# Patient Record
Sex: Male | Born: 1955 | Race: Black or African American | Hispanic: No | Marital: Single | Smoking: Current every day smoker
Health system: Southern US, Community
[De-identification: ages and names within clinical notes are randomized; demographics above are authoritative.]

## PROBLEM LIST (undated history)

## (undated) DIAGNOSIS — E119 Type 2 diabetes mellitus without complications: Secondary | ICD-10-CM

## (undated) HISTORY — PX: EYE SURGERY: SHX253

---

## 2000-01-06 ENCOUNTER — Encounter: Payer: Self-pay | Admitting: Emergency Medicine

## 2000-01-06 ENCOUNTER — Emergency Department (HOSPITAL_COMMUNITY): Admission: EM | Admit: 2000-01-06 | Discharge: 2000-01-06 | Payer: Self-pay | Admitting: Emergency Medicine

## 2009-04-14 ENCOUNTER — Ambulatory Visit: Payer: Self-pay | Admitting: Internal Medicine

## 2009-04-18 ENCOUNTER — Ambulatory Visit: Payer: Self-pay | Admitting: Internal Medicine

## 2009-05-10 ENCOUNTER — Encounter (INDEPENDENT_AMBULATORY_CARE_PROVIDER_SITE_OTHER): Payer: Self-pay | Admitting: Family Medicine

## 2009-05-10 ENCOUNTER — Ambulatory Visit: Payer: Self-pay | Admitting: Internal Medicine

## 2009-05-10 LAB — CONVERTED CEMR LAB
ALT: 24 units/L (ref 0–53)
AST: 17 units/L (ref 0–37)
Albumin: 4.3 g/dL (ref 3.5–5.2)
Alkaline Phosphatase: 105 units/L (ref 39–117)
BUN: 13 mg/dL (ref 6–23)
Basophils Absolute: 0 10*3/uL (ref 0.0–0.1)
Basophils Relative: 0 % (ref 0–1)
CO2: 22 meq/L (ref 19–32)
Calcium: 9.2 mg/dL (ref 8.4–10.5)
Chloride: 105 meq/L (ref 96–112)
Cholesterol: 215 mg/dL — ABNORMAL HIGH (ref 0–200)
Creatinine, Ser: 1.11 mg/dL (ref 0.40–1.50)
Eosinophils Absolute: 0.6 10*3/uL (ref 0.0–0.7)
Eosinophils Relative: 5 % (ref 0–5)
Glucose, Bld: 77 mg/dL (ref 70–99)
HCT: 48.3 % (ref 39.0–52.0)
HDL: 49 mg/dL (ref >39)
Hemoglobin: 15 g/dL (ref 13.0–17.0)
LDL Cholesterol: 128 mg/dL — ABNORMAL HIGH (ref 0–99)
Lymphocytes Relative: 39 % (ref 12–46)
Lymphs Abs: 4.3 10*3/uL — ABNORMAL HIGH (ref 0.7–4.0)
MCHC: 31.1 g/dL (ref 30.0–36.0)
MCV: 89.9 fL (ref 78.0–100.0)
Microalb, Ur: 0.99 mg/dL (ref 0.00–1.89)
Monocytes Absolute: 1.1 10*3/uL — ABNORMAL HIGH (ref 0.1–1.0)
Monocytes Relative: 10 % (ref 3–12)
Neutro Abs: 5 10*3/uL (ref 1.7–7.7)
Neutrophils Relative %: 45 % (ref 43–77)
Platelets: 266 10*3/uL (ref 150–400)
Potassium: 4.3 meq/L (ref 3.5–5.3)
RBC: 5.37 M/uL (ref 4.22–5.81)
RDW: 13.6 % (ref 11.5–15.5)
Sodium: 140 meq/L (ref 135–145)
TSH: 1.96 microintl units/mL (ref 0.350–4.500)
Testosterone: 198.09 ng/dL — ABNORMAL LOW (ref 350–890)
Total Bilirubin: 0.6 mg/dL (ref 0.3–1.2)
Total CHOL/HDL Ratio: 4.4
Total Protein: 6.8 g/dL (ref 6.0–8.3)
Triglycerides: 188 mg/dL — ABNORMAL HIGH (ref <150)
VLDL: 38 mg/dL (ref 0–40)
Vit D, 25-Hydroxy: 27 ng/mL — ABNORMAL LOW (ref 30–89)
WBC: 11.1 10*3/uL — ABNORMAL HIGH (ref 4.0–10.5)

## 2009-05-16 ENCOUNTER — Encounter (INDEPENDENT_AMBULATORY_CARE_PROVIDER_SITE_OTHER): Payer: Self-pay | Admitting: Family Medicine

## 2009-05-16 ENCOUNTER — Ambulatory Visit: Payer: Self-pay | Admitting: Internal Medicine

## 2009-05-16 LAB — CONVERTED CEMR LAB: Microalb, Ur: 0.5 mg/dL (ref 0.00–1.89)

## 2009-07-06 ENCOUNTER — Ambulatory Visit: Payer: Self-pay | Admitting: Internal Medicine

## 2009-09-07 ENCOUNTER — Ambulatory Visit: Payer: Self-pay | Admitting: Internal Medicine

## 2010-02-02 ENCOUNTER — Encounter (INDEPENDENT_AMBULATORY_CARE_PROVIDER_SITE_OTHER): Payer: Self-pay | Admitting: *Deleted

## 2010-02-02 LAB — CONVERTED CEMR LAB
ALT: 28 units/L (ref 0–53)
AST: 17 units/L (ref 0–37)
Albumin: 4.8 g/dL (ref 3.5–5.2)
Alkaline Phosphatase: 116 units/L (ref 39–117)
BUN: 13 mg/dL (ref 6–23)
CO2: 24 meq/L (ref 19–32)
Calcium: 9.5 mg/dL (ref 8.4–10.5)
Chloride: 99 meq/L (ref 96–112)
Cholesterol: 200 mg/dL (ref 0–200)
Creatinine, Ser: 0.97 mg/dL (ref 0.40–1.50)
Glucose, Bld: 244 mg/dL — ABNORMAL HIGH (ref 70–99)
HDL: 35 mg/dL — ABNORMAL LOW (ref >39)
Hgb A1c MFr Bld: 10.5 % — ABNORMAL HIGH (ref <5.7)
LDL Cholesterol: 138 mg/dL — ABNORMAL HIGH (ref 0–99)
Potassium: 4.2 meq/L (ref 3.5–5.3)
Sodium: 135 meq/L (ref 135–145)
Total Bilirubin: 0.6 mg/dL (ref 0.3–1.2)
Total CHOL/HDL Ratio: 5.7
Total Protein: 7 g/dL (ref 6.0–8.3)
Triglycerides: 135 mg/dL (ref <150)
VLDL: 27 mg/dL (ref 0–40)

## 2012-06-17 ENCOUNTER — Encounter (HOSPITAL_COMMUNITY): Payer: Self-pay | Admitting: Emergency Medicine

## 2012-06-17 ENCOUNTER — Emergency Department (HOSPITAL_COMMUNITY): Payer: No Typology Code available for payment source

## 2012-06-17 ENCOUNTER — Emergency Department (HOSPITAL_COMMUNITY)
Admission: EM | Admit: 2012-06-17 | Discharge: 2012-06-17 | Disposition: A | Discharge status: Home / Self Care | Payer: No Typology Code available for payment source | Attending: Emergency Medicine | Admitting: Emergency Medicine

## 2012-06-17 DIAGNOSIS — S0993XA Unspecified injury of face, initial encounter: Secondary | ICD-10-CM | POA: Insufficient documentation

## 2012-06-17 DIAGNOSIS — Z79899 Other long term (current) drug therapy: Secondary | ICD-10-CM | POA: Insufficient documentation

## 2012-06-17 DIAGNOSIS — Y9389 Activity, other specified: Secondary | ICD-10-CM | POA: Insufficient documentation

## 2012-06-17 DIAGNOSIS — M542 Cervicalgia: Secondary | ICD-10-CM

## 2012-06-17 DIAGNOSIS — F17200 Nicotine dependence, unspecified, uncomplicated: Secondary | ICD-10-CM | POA: Insufficient documentation

## 2012-06-17 DIAGNOSIS — M549 Dorsalgia, unspecified: Secondary | ICD-10-CM

## 2012-06-17 DIAGNOSIS — Y92410 Unspecified street and highway as the place of occurrence of the external cause: Secondary | ICD-10-CM | POA: Insufficient documentation

## 2012-06-17 DIAGNOSIS — IMO0002 Reserved for concepts with insufficient information to code with codable children: Secondary | ICD-10-CM | POA: Insufficient documentation

## 2012-06-17 DIAGNOSIS — E119 Type 2 diabetes mellitus without complications: Secondary | ICD-10-CM | POA: Insufficient documentation

## 2012-06-17 HISTORY — DX: Type 2 diabetes mellitus without complications: E11.9

## 2012-06-17 MED ORDER — OXYCODONE-ACETAMINOPHEN 5-325 MG PO TABS
1.0000 | ORAL_TABLET | Freq: Once | ORAL | Status: AC
  Administered 2012-06-17: 1 via ORAL
  Filled 2012-06-17: qty 1

## 2012-06-17 MED ORDER — METHOCARBAMOL 500 MG PO TABS: 500.0000 mg | ORAL_TABLET | Freq: Two times a day (BID) | ORAL | Status: DC | PRN

## 2012-06-17 MED ORDER — OXYCODONE-ACETAMINOPHEN 5-325 MG PO TABS: 1.0000 | ORAL_TABLET | ORAL | Status: DC | PRN
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
## 2012-06-19 NOTE — ED Provider Notes (Signed)
Medical screening examination/treatment/procedure(s) were performed by non-physician practitioner and as supervising physician I was immediately available for consultation/collaboration.   Gavin Pound. Kristin Lamagna, MD 06/19/12 1256

## 2014-06-02 ENCOUNTER — Emergency Department (HOSPITAL_COMMUNITY): Payer: 59

## 2014-06-02 ENCOUNTER — Emergency Department (HOSPITAL_COMMUNITY)
Admission: EM | Admit: 2014-06-02 | Discharge: 2014-06-02 | Disposition: A | Discharge status: Home / Self Care | Payer: 59 | Attending: Emergency Medicine | Admitting: Emergency Medicine

## 2014-06-02 ENCOUNTER — Encounter (HOSPITAL_COMMUNITY): Payer: Self-pay | Admitting: *Deleted

## 2014-06-02 DIAGNOSIS — S0181XA Laceration without foreign body of other part of head, initial encounter: Secondary | ICD-10-CM | POA: Insufficient documentation

## 2014-06-02 DIAGNOSIS — S20219A Contusion of unspecified front wall of thorax, initial encounter: Secondary | ICD-10-CM | POA: Insufficient documentation

## 2014-06-02 DIAGNOSIS — Z79899 Other long term (current) drug therapy: Secondary | ICD-10-CM | POA: Diagnosis not present

## 2014-06-02 DIAGNOSIS — Y998 Other external cause status: Secondary | ICD-10-CM | POA: Diagnosis not present

## 2014-06-02 DIAGNOSIS — Y9389 Activity, other specified: Secondary | ICD-10-CM | POA: Diagnosis not present

## 2014-06-02 DIAGNOSIS — Z23 Encounter for immunization: Secondary | ICD-10-CM | POA: Diagnosis not present

## 2014-06-02 DIAGNOSIS — Y9289 Other specified places as the place of occurrence of the external cause: Secondary | ICD-10-CM | POA: Insufficient documentation

## 2014-06-02 DIAGNOSIS — S0101XA Laceration without foreign body of scalp, initial encounter: Secondary | ICD-10-CM | POA: Insufficient documentation

## 2014-06-02 DIAGNOSIS — E119 Type 2 diabetes mellitus without complications: Secondary | ICD-10-CM | POA: Insufficient documentation

## 2014-06-02 DIAGNOSIS — Z720 Tobacco use: Secondary | ICD-10-CM | POA: Diagnosis not present

## 2014-06-02 DIAGNOSIS — S0990XA Unspecified injury of head, initial encounter: Secondary | ICD-10-CM | POA: Diagnosis present

## 2014-06-02 MED ORDER — MORPHINE SULFATE 4 MG/ML IJ SOLN
4.0000 mg | Freq: Once | INTRAMUSCULAR | Status: AC
  Administered 2014-06-02: 4 mg via INTRAVENOUS
  Filled 2014-06-02: qty 1

## 2014-06-02 MED ORDER — TETANUS-DIPHTH-ACELL PERTUSSIS 5-2.5-18.5 LF-MCG/0.5 IM SUSP
0.5000 mL | Freq: Once | INTRAMUSCULAR | Status: AC
  Administered 2014-06-02: 0.5 mL via INTRAMUSCULAR
  Filled 2014-06-02: qty 0.5

## 2014-06-02 MED ORDER — LIDOCAINE-EPINEPHRINE 1 %-1:100000 IJ SOLN
10.0000 mL | Freq: Once | INTRAMUSCULAR | Status: AC
  Administered 2014-06-02: 10 mL
  Filled 2014-06-02: qty 1

## 2014-06-02 MED ORDER — SODIUM CHLORIDE 0.9 % IV BOLUS (SEPSIS)
1000.0000 mL | Freq: Once | INTRAVENOUS | Status: AC
  Administered 2014-06-02: 1000 mL via INTRAVENOUS

## 2014-06-02 MED ORDER — HYDROCODONE-ACETAMINOPHEN 5-325 MG PO TABS: 1.0000 | ORAL_TABLET | Freq: Two times a day (BID) | ORAL | Status: DC | PRN

## 2014-06-02 NOTE — ED Notes (Signed)
Discharge instructions reviewed, voiced understanding.  

## 2014-06-02 NOTE — ED Notes (Signed)
Patient cleaned up of dried blood, Dr Mora Bellmanni at the bedside to suture and skin glue to upper lip, bacitracin applied
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
## 2014-06-13 ENCOUNTER — Emergency Department (HOSPITAL_COMMUNITY)
Admission: EM | Admit: 2014-06-13 | Discharge: 2014-06-13 | Disposition: A | Discharge status: Home / Self Care | Payer: 59 | Attending: Emergency Medicine | Admitting: Emergency Medicine

## 2014-06-13 ENCOUNTER — Encounter (HOSPITAL_COMMUNITY): Payer: Self-pay | Admitting: Physical Medicine and Rehabilitation

## 2014-06-13 DIAGNOSIS — S0993XD Unspecified injury of face, subsequent encounter: Secondary | ICD-10-CM | POA: Insufficient documentation

## 2014-06-13 DIAGNOSIS — E119 Type 2 diabetes mellitus without complications: Secondary | ICD-10-CM | POA: Insufficient documentation

## 2014-06-13 DIAGNOSIS — Z79899 Other long term (current) drug therapy: Secondary | ICD-10-CM | POA: Insufficient documentation

## 2014-06-13 DIAGNOSIS — Z7982 Long term (current) use of aspirin: Secondary | ICD-10-CM | POA: Insufficient documentation

## 2014-06-13 DIAGNOSIS — Z4802 Encounter for removal of sutures: Secondary | ICD-10-CM

## 2014-06-13 DIAGNOSIS — Z720 Tobacco use: Secondary | ICD-10-CM | POA: Diagnosis not present

## 2014-06-13 MED ORDER — HYDROCODONE-ACETAMINOPHEN 5-325 MG PO TABS: 1.0000 | ORAL_TABLET | Freq: Four times a day (QID) | ORAL | Status: DC | PRN

## 2014-06-13 MED ORDER — IBUPROFEN 800 MG PO TABS: 800.0000 mg | ORAL_TABLET | Freq: Three times a day (TID) | ORAL | Status: AC | PRN

## 2014-06-13 NOTE — Discharge Instructions (Signed)
Return here as needed.  Follow-up with a dentist .  Rinse your mouth with warm water and peroxide 3 times a day
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
## 2015-03-10 ENCOUNTER — Telehealth: Payer: Self-pay | Admitting: *Deleted

## 2015-03-10 NOTE — Telephone Encounter (Signed)
Called lm about missed pre-visit appointment and to call us back to reschedule before 5pm or we will need to cxl procedure-adm

## 2015-03-11 ENCOUNTER — Encounter: Payer: Self-pay | Admitting: Internal Medicine

## 2015-03-15 ENCOUNTER — Encounter: Payer: 59 | Admitting: Gastroenterology

## 2017-01-12 ENCOUNTER — Encounter (HOSPITAL_COMMUNITY): Payer: Self-pay | Admitting: Emergency Medicine

## 2017-01-12 ENCOUNTER — Emergency Department (HOSPITAL_COMMUNITY)
Admission: EM | Admit: 2017-01-12 | Discharge: 2017-01-12 | Disposition: A | Discharge status: Home / Self Care | Payer: No Typology Code available for payment source | Attending: Emergency Medicine | Admitting: Emergency Medicine

## 2017-01-12 ENCOUNTER — Other Ambulatory Visit: Payer: Self-pay

## 2017-01-12 DIAGNOSIS — M25511 Pain in right shoulder: Secondary | ICD-10-CM | POA: Insufficient documentation

## 2017-01-12 DIAGNOSIS — E119 Type 2 diabetes mellitus without complications: Secondary | ICD-10-CM | POA: Diagnosis not present

## 2017-01-12 DIAGNOSIS — M62838 Other muscle spasm: Secondary | ICD-10-CM | POA: Diagnosis not present

## 2017-01-12 DIAGNOSIS — M542 Cervicalgia: Secondary | ICD-10-CM

## 2017-01-12 DIAGNOSIS — Z7984 Long term (current) use of oral hypoglycemic drugs: Secondary | ICD-10-CM | POA: Insufficient documentation

## 2017-01-12 DIAGNOSIS — M25512 Pain in left shoulder: Secondary | ICD-10-CM | POA: Insufficient documentation

## 2017-01-12 MED ORDER — CYCLOBENZAPRINE HCL 5 MG PO TABS: 5.0000 mg | ORAL_TABLET | Freq: Two times a day (BID) | ORAL | 0 refills | Status: AC | PRN

## 2017-01-12 MED ORDER — CYCLOBENZAPRINE HCL 10 MG PO TABS
5.0000 mg | ORAL_TABLET | Freq: Once | ORAL | Status: AC
  Administered 2017-01-12: 5 mg via ORAL
  Filled 2017-01-12: qty 1

## 2017-01-12 NOTE — Discharge Instructions (Addendum)
Please take Ibuprofen (Advil, motrin) and Tylenol (acetaminophen) to relieve your pain.  You may take up to 600 MG (3 pills) of normal strength ibuprofen every 8 hours as needed.  In between doses of ibuprofen you make take tylenol, up to 1,000 mg (two extra strength pills).  Do not take more than 3,000 mg tylenol in a 24 hour period.  Please check all medication labels as many medications such as pain and cold medications may contain tylenol.  Do not drink alcohol while taking these medications.  Do not take other NSAID'S while taking ibuprofen (such as aleve or naproxen).  Please take ibuprofen with food to decrease stomach upset.  Today you received medications that may make you sleepy or impair your ability to make decisions.  For the next 24 hours please do not drive, operate heavy machinery, care for a small child with out another adult present, or perform any activities that may cause harm to you or someone else if you were to fall asleep or be impaired.   You are being prescribed a medication which may make you sleepy. Please follow up of listed precautions for at least 24 hours after taking one dose.  The best way to get rid of muscle pain is by taking NSAIDS (Ibuprofen), using heat, massage therapy, and gentle stretching/range of motion exercises.  It can take a few days for your muscles to start feeling better.
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
## 2017-01-12 NOTE — ED Notes (Signed)
Declined W/C at D/C and was escorted to lobby by RN. 

## 2018-11-25 ENCOUNTER — Other Ambulatory Visit: Payer: Self-pay

## 2018-11-25 DIAGNOSIS — Z20822 Contact with and (suspected) exposure to covid-19: Secondary | ICD-10-CM

## 2018-11-26 LAB — NOVEL CORONAVIRUS, NAA: SARS-CoV-2, NAA: NOT DETECTED

## 2019-08-05 ENCOUNTER — Ambulatory Visit: Payer: HRSA Program | Attending: Internal Medicine

## 2019-08-05 DIAGNOSIS — Z20822 Contact with and (suspected) exposure to covid-19: Secondary | ICD-10-CM | POA: Insufficient documentation

## 2019-08-06 LAB — SARS-COV-2, NAA 2 DAY TAT

## 2019-08-06 LAB — NOVEL CORONAVIRUS, NAA: SARS-CoV-2, NAA: NOT DETECTED

## 2019-08-07 ENCOUNTER — Ambulatory Visit (HOSPITAL_COMMUNITY)
Admission: EM | Admit: 2019-08-07 | Discharge: 2019-08-07 | Disposition: A | Discharge status: Home / Self Care | Payer: Self-pay

## 2019-08-07 ENCOUNTER — Encounter (HOSPITAL_COMMUNITY): Payer: Self-pay | Admitting: Emergency Medicine

## 2019-08-07 ENCOUNTER — Other Ambulatory Visit: Payer: Self-pay

## 2019-08-07 DIAGNOSIS — Z20822 Contact with and (suspected) exposure to covid-19: Secondary | ICD-10-CM
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
## 2019-08-07 NOTE — ED Provider Notes (Signed)
[facility]    CSN: [id number] Arrival date & time: 08/07/19  7371      History   Chief Complaint Chief Complaint  Patient presents with  . covid test for travel    HPI Anthony Tate is a 65 y.o. male.   Patient reports urgent care for Covid test for travel.  Denies symptoms.  Denies cough, headache, fever, chills, body ache, sore throat, nasal congestion, abdominal pain, nausea, vomiting, diarrhea.  No change in taste or smell.  No sick contacts.     Past Medical History:  Diagnosis Date  . Diabetes mellitus without complication (HCC)     There are no problems to display for this patient.   Past Surgical History:  Procedure Laterality Date  . EYE SURGERY     growth to eye removed       Home Medications    Prior to Admission medications   Medication Sig Start Date End Date Taking? Authorizing Provider  aspirin EC 81 MG tablet Take 81 mg by mouth daily.    [provider]  cyclobenzaprine (FLEXERIL) 5 MG tablet Take 1-2 tablets (5-10 mg total) by mouth 2 (two) times daily as needed for muscle spasms. 01/12/17   Cristina Gong, PA-C  glipiZIDE (GLUCOTROL) 10 MG tablet Take 10 mg by mouth 2 (two) times daily before a meal.    [provider]  HYDROcodone-acetaminophen (NORCO/VICODIN) 5-325 MG per tablet Take 1 tablet by mouth every 6 (six) hours as needed for moderate pain. 06/13/14   Lawyer, Cristal Deer, PA-C  ibuprofen (ADVIL,MOTRIN) 800 MG tablet Take 1 tablet (800 mg total) by mouth every 8 (eight) hours as needed. 06/13/14   Lawyer, Cristal Deer, PA-C  metFORMIN (GLUCOPHAGE) 500 MG tablet Take 500 mg by mouth 2 (two) times daily with a meal.    [provider]    Family History History reviewed. No pertinent family history.  Social History Social History   Tobacco Use  . Smoking status: Current Every Day Smoker    Packs/day: 0.50  . Smokeless tobacco: Current User  Substance Use Topics  . Alcohol use: No  .  Drug use: No     Allergies   Patient has no known allergies.   Review of Systems Review of Systems   Physical Exam Triage Vital Signs ED Triage Vitals [08/07/19 1059]  Enc Vitals Group     BP 139/74     Pulse Rate 92     Resp 18     Temp 98.2 F (36.8 C)     Temp Source Oral     SpO2 100 %     Weight      Height      Head Circumference      Peak Flow      Pain Score 0     Pain Loc      Pain Edu?      Excl. in GC?    No data found.  Updated Vital Signs BP 139/74 (BP Location: Right Arm)   Pulse 92   Temp 98.2 F (36.8 C) (Oral)   Resp 18   SpO2 100%   Visual Acuity Right Eye Distance:   Left Eye Distance:   Bilateral Distance:    Right Eye Near:   Left Eye Near:    Bilateral Near:     Physical Exam Vitals and nursing note reviewed.  Constitutional:      Appearance: Normal appearance.  Neurological:     Mental Status:  He is alert.      UC Treatments / Results  Labs (all labs ordered are listed, but only abnormal results are displayed) Labs Reviewed - No data to display  EKG   Radiology No results found.  Procedures Procedures (including critical care time)  Medications Ordered in UC Medications - No data to display  Initial Impression / Assessment and Plan / UC Course  I have reviewed the triage vital signs and the nursing notes.  Pertinent labs & imaging results that were available during my care of the patient were reviewed by me and considered in my medical decision making (see chart for details).     #Covid test Patient reports for Covid test for travel.  Asymptomatic.  Covid PCR sent. Final Clinical Impressions(s) / UC Diagnoses   Final diagnoses:  Encounter for screening laboratory testing for COVID-19 virus in asymptomatic patient     Discharge Instructions     We have sent your COVID test  If your Covid-19 test is positive, you will receive a phone call from [facility] regarding your results. Negative test  results are not called. Both positive and negative results area always visible on MyChart. If you do not have a MyChart account, sign up instructions are in your discharge papers.   Persons who are directed to care for themselves at home may discontinue isolation under the following conditions:  . At least 10 days have passed since symptom onset and . At least 24 hours have passed without running a fever (this means without the use of fever-reducing medications) and . Other symptoms have improved.  Persons infected with COVID-19 who never develop symptoms may discontinue isolation and other precautions 10 days after the date of their first positive COVID-19 test.       ED Prescriptions    None     PDMP not reviewed this encounter.   Hermelinda Medicus, PA-C 08/08/19 1025

## 2019-08-07 NOTE — Discharge Instructions (Signed)
We have sent your COVID test  If your Covid-19 test is positive, you will receive a phone call from [facility] regarding your results. Negative test results are not called. Both positive and negative results area always visible on MyChart. If you do not have a MyChart account, sign up instructions are in your discharge papers.   Persons who are directed to care for themselves at home may discontinue isolation under the following conditions:   At least 10 days have passed since symptom onset and  At least 24 hours have passed without running a fever (this means without the use of fever-reducing medications) and  Other symptoms have improved.  Persons infected with COVID-19 who never develop symptoms may discontinue isolation and other precautions 10 days after the date of their first positive COVID-19 test.

## 2019-08-08 ENCOUNTER — Encounter (HOSPITAL_COMMUNITY): Payer: Self-pay | Admitting: Emergency Medicine

## 2019-08-08 ENCOUNTER — Other Ambulatory Visit: Payer: Self-pay

## 2019-08-08 ENCOUNTER — Ambulatory Visit (HOSPITAL_COMMUNITY)
Admission: EM | Admit: 2019-08-08 | Discharge: 2019-08-08 | Disposition: A | Discharge status: Home / Self Care | Payer: HRSA Program | Attending: Physician Assistant | Admitting: Physician Assistant

## 2019-08-08 DIAGNOSIS — Z20822 Contact with and (suspected) exposure to covid-19: Secondary | ICD-10-CM | POA: Insufficient documentation

## 2019-08-08 LAB — SARS CORONAVIRUS 2 (TAT 6-24 HRS): SARS Coronavirus 2: NEGATIVE

## 2019-08-08 NOTE — ED Notes (Signed)
Darr, PA aware of patient being in department

## 2019-08-08 NOTE — ED Notes (Signed)
covid sample obtained.

## 2019-08-08 NOTE — ED Triage Notes (Signed)
Patient needing covid test for travel 7/18.

## 2020-07-23 ENCOUNTER — Telehealth: Payer: Self-pay

## 2020-07-23 NOTE — Telephone Encounter (Signed)
Assisted pt with MyChart User name.

## 2020-07-26 ENCOUNTER — Ambulatory Visit: Payer: Self-pay | Attending: Internal Medicine

## 2020-07-26 DIAGNOSIS — Z20822 Contact with and (suspected) exposure to covid-19: Secondary | ICD-10-CM | POA: Insufficient documentation

## 2020-07-27 LAB — NOVEL CORONAVIRUS, NAA: SARS-CoV-2, NAA: NOT DETECTED

## 2020-07-27 LAB — SARS-COV-2, NAA 2 DAY TAT

## 2021-02-16 ENCOUNTER — Emergency Department (HOSPITAL_COMMUNITY): Payer: BLUE CROSS/BLUE SHIELD

## 2021-02-16 ENCOUNTER — Emergency Department (HOSPITAL_COMMUNITY)
Admission: EM | Admit: 2021-02-16 | Discharge: 2021-02-16 | Disposition: A | Discharge status: Home / Self Care | Payer: BLUE CROSS/BLUE SHIELD | Attending: Emergency Medicine | Admitting: Emergency Medicine

## 2021-02-16 DIAGNOSIS — Y92410 Unspecified street and highway as the place of occurrence of the external cause: Secondary | ICD-10-CM | POA: Insufficient documentation

## 2021-02-16 DIAGNOSIS — Z7984 Long term (current) use of oral hypoglycemic drugs: Secondary | ICD-10-CM | POA: Diagnosis not present

## 2021-02-16 DIAGNOSIS — Z7982 Long term (current) use of aspirin: Secondary | ICD-10-CM | POA: Insufficient documentation

## 2021-02-16 DIAGNOSIS — R5383 Other fatigue: Secondary | ICD-10-CM | POA: Diagnosis not present

## 2021-02-16 DIAGNOSIS — S199XXA Unspecified injury of neck, initial encounter: Secondary | ICD-10-CM | POA: Diagnosis present

## 2021-02-16 DIAGNOSIS — J019 Acute sinusitis, unspecified: Secondary | ICD-10-CM | POA: Insufficient documentation

## 2021-02-16 DIAGNOSIS — S161XXA Strain of muscle, fascia and tendon at neck level, initial encounter: Secondary | ICD-10-CM | POA: Insufficient documentation

## 2021-02-16 DIAGNOSIS — S0990XA Unspecified injury of head, initial encounter: Secondary | ICD-10-CM | POA: Diagnosis not present

## 2021-02-16 MED ORDER — HYDROCODONE-ACETAMINOPHEN 5-325 MG PO TABS: 1.0000 | ORAL_TABLET | ORAL | 0 refills | Status: AC | PRN
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
## 2021-02-16 NOTE — Discharge Instructions (Signed)
Use ice on the sore areas 3-4 times a day for 2 days.  After that use heat to help the discomfort.  Try taking ibuprofen for pain.  We are giving you a narcotic pain reliever to use if needed for more severe pain.  Do not drive within 6 hours of taking the narcotic pain reliever.
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
